# Patient Record
Sex: Male | Born: 1980 | Race: White | Hispanic: No | Marital: Single | Smoking: Never smoker
Health system: Southern US, Community
[De-identification: ages and names within clinical notes are randomized; demographics above are authoritative.]

## PROBLEM LIST (undated history)

## (undated) ENCOUNTER — Emergency Department (HOSPITAL_BASED_OUTPATIENT_CLINIC_OR_DEPARTMENT_OTHER): Admission: EM | Discharge status: Absent without leave | Payer: Medicare Other

## (undated) DIAGNOSIS — F419 Anxiety disorder, unspecified: Secondary | ICD-10-CM

## (undated) HISTORY — PX: APPENDECTOMY: SHX54

---

## 2013-02-09 ENCOUNTER — Encounter (HOSPITAL_BASED_OUTPATIENT_CLINIC_OR_DEPARTMENT_OTHER): Payer: Self-pay | Admitting: Family Medicine

## 2013-02-09 ENCOUNTER — Emergency Department (HOSPITAL_BASED_OUTPATIENT_CLINIC_OR_DEPARTMENT_OTHER)
Admission: EM | Admit: 2013-02-09 | Discharge: 2013-02-09 | Disposition: A | Discharge status: Home / Self Care | Payer: Non-veteran care | Attending: Emergency Medicine | Admitting: Emergency Medicine

## 2013-02-09 DIAGNOSIS — F411 Generalized anxiety disorder: Secondary | ICD-10-CM | POA: Insufficient documentation

## 2013-02-09 DIAGNOSIS — R197 Diarrhea, unspecified: Secondary | ICD-10-CM | POA: Diagnosis not present

## 2013-02-09 DIAGNOSIS — R110 Nausea: Secondary | ICD-10-CM | POA: Insufficient documentation

## 2013-02-09 DIAGNOSIS — R1013 Epigastric pain: Secondary | ICD-10-CM | POA: Diagnosis present

## 2013-02-09 DIAGNOSIS — K2970 Gastritis, unspecified, without bleeding: Secondary | ICD-10-CM

## 2013-02-09 DIAGNOSIS — K2990 Gastroduodenitis, unspecified, without bleeding: Secondary | ICD-10-CM | POA: Insufficient documentation

## 2013-02-09 DIAGNOSIS — Z79899 Other long term (current) drug therapy: Secondary | ICD-10-CM | POA: Insufficient documentation

## 2013-02-09 HISTORY — DX: Anxiety disorder, unspecified: F41.9

## 2013-02-09 LAB — COMPREHENSIVE METABOLIC PANEL
Albumin: 4.1 g/dL (ref 3.5–5.2)
Alkaline Phosphatase: 69 U/L (ref 39–117)
BUN: 10 mg/dL (ref 6–23)
Calcium: 9.7 mg/dL (ref 8.4–10.5)
GFR calc Af Amer: >90 mL/min (ref >90)
Glucose, Bld: 111 mg/dL — ABNORMAL HIGH (ref 70–99)
Potassium: 3.6 mEq/L (ref 3.5–5.1)
Total Protein: 7.4 g/dL (ref 6.0–8.3)

## 2013-02-09 LAB — URINALYSIS, ROUTINE W REFLEX MICROSCOPIC
Leukocytes, UA: NEGATIVE
Nitrite: NEGATIVE
Specific Gravity, Urine: 1.024 (ref 1.005–1.030)
Urobilinogen, UA: 0.2 mg/dL (ref 0.0–1.0)
pH: 5 (ref 5.0–8.0)

## 2013-02-09 LAB — CBC WITH DIFFERENTIAL/PLATELET
Basophils Relative: 0 % (ref 0–1)
Eosinophils Absolute: 0.1 10*3/uL (ref 0.0–0.7)
Eosinophils Relative: 1 % (ref 0–5)
Hemoglobin: 16.6 g/dL (ref 13.0–17.0)
Lymphs Abs: 2 10*3/uL (ref 0.7–4.0)
MCH: 31.4 pg (ref 26.0–34.0)
MCHC: 35.9 g/dL (ref 30.0–36.0)
MCV: 87.7 fL (ref 78.0–100.0)
Monocytes Relative: 9 % (ref 3–12)
Neutrophils Relative %: 56 % (ref 43–77)
Platelets: 212 10*3/uL (ref 150–400)

## 2013-02-09 LAB — LIPASE, BLOOD: Lipase: 23 U/L (ref 11–59)

## 2013-02-09 MED ORDER — PANTOPRAZOLE SODIUM 20 MG PO TBEC: 20.0000 mg | DELAYED_RELEASE_TABLET | Freq: Every day | ORAL | Status: AC

## 2013-02-09 MED ORDER — GI COCKTAIL ~~LOC~~
30.0000 mL | Freq: Once | ORAL | Status: AC
  Administered 2013-02-09: 30 mL via ORAL
  Filled 2013-02-09: qty 30

## 2013-02-09 NOTE — ED Notes (Addendum)
Pt c/o abdominal pain x 3 days with diarrhea on day 1. Pt sts he took immodium and diarrhea resolved. Denies n/v, fever. Pt sts pain feels like a "cramp". Pt reports eating and drinking normal.

## 2013-02-09 NOTE — ED Provider Notes (Signed)
History    CSN: [id number] Arrival date & time 02/09/13  0818  First MD Initiated Contact with Patient 02/09/13 [phone]     Chief Complaint  Patient presents with  . Abdominal Pain   (Consider location/radiation/quality/duration/timing/severity/associated sxs/prior Treatment) HPI Comments: Patient presents with a three-day history of worsening abdominal pain. He describes as an achy pain mostly in his epigastric area that radiates to his lower abdomen bilaterally. He had some nausea and diarrhea 3 days ago when it first started. He took an Imodium at that point and the diarrhea subsided. He currently denies any nausea or vomiting. He denies any fevers or chills. He denies any urinary symptoms. He does have a history of an appendectomy but no other history of bowel problems. He states the pain is not related to eating and then he's been receiving crackers and soup for the last couple days.  Patient is a 32 y.o. male presenting with abdominal pain.  Abdominal Pain Associated symptoms include abdominal pain. Pertinent negatives include no chest pain, no headaches and no shortness of breath.   Past Medical History  Diagnosis Date  . Anxiety    Past Surgical History  Procedure Laterality Date  . Appendectomy     No family history on file. History  Substance Use Topics  . Smoking status: Never Smoker   . Smokeless tobacco: Not on file  . Alcohol Use: Yes    Review of Systems  Constitutional: Negative for fever, chills, diaphoresis and fatigue.  HENT: Negative for congestion, rhinorrhea and sneezing.   Eyes: Negative.   Respiratory: Negative for cough, chest tightness and shortness of breath.   Cardiovascular: Negative for chest pain and leg swelling.  Gastrointestinal: Positive for nausea, abdominal pain and diarrhea. Negative for vomiting and blood in stool.  Genitourinary: Negative for frequency, hematuria, flank pain and difficulty urinating.  Musculoskeletal: Negative for back  pain and arthralgias.  Skin: Negative for rash.  Neurological: Negative for dizziness, speech difficulty, weakness, numbness and headaches.    Allergies  Review of patient's allergies indicates no known allergies.  Home Medications   Current Outpatient Rx  Name  Route  Sig  Dispense  Refill  . LORazepam (ATIVAN) 0.5 MG tablet   Oral   Take 0.5 mg by mouth as needed for anxiety.         . pantoprazole (PROTONIX) 20 MG tablet   Oral   Take 1 tablet (20 mg total) by mouth daily.   30 tablet   0    BP 130/92  Pulse 65  Temp(Src) 98 F (36.7 C) (Oral)  Resp 16  Ht 5\' 7"  (1.702 m)  Wt 202 lb (91.627 kg)  BMI 31.63 kg/m2  SpO2 100% Physical Exam  Constitutional: He is oriented to person, place, and time. He appears well-developed and well-nourished.  HENT:  Head: Normocephalic and atraumatic.  Eyes: Pupils are equal, round, and reactive to light.  Neck: Normal range of motion. Neck supple.  Cardiovascular: Normal rate, regular rhythm and normal heart sounds.   Pulmonary/Chest: Effort normal and breath sounds normal. No respiratory distress. He has no wheezes. He has no rales. He exhibits no tenderness.  Abdominal: Soft. Bowel sounds are normal. There is tenderness (mild tenderness on palpation of the epigastrium. There's also some mild tenderness across the lower abdomen but patient complains of most tenderness around the epigastrium. No CVA tenderness). There is no rebound and no guarding.  Musculoskeletal: Normal range of motion. He exhibits no edema.  Lymphadenopathy:  He has no cervical adenopathy.  Neurological: He is alert and oriented to person, place, and time.  Skin: Skin is warm and dry. No rash noted.  Psychiatric: He has a normal mood and affect.    ED Course  Procedures (including critical care time) Results for orders placed during the hospital encounter of 02/09/13  CBC WITH DIFFERENTIAL      Result Value Range   WBC 5.9  4.0 - 10.5 K/uL   RBC 5.28   4.22 - 5.81 MIL/uL   Hemoglobin 16.6  13.0 - 17.0 g/dL   HCT 16.1  09.6 - 04.5 %   MCV 87.7  78.0 - 100.0 fL   MCH 31.4  26.0 - 34.0 pg   MCHC 35.9  30.0 - 36.0 g/dL   RDW 40.9  81.1 - 91.4 %   Platelets 212  150 - 400 K/uL   Neutrophils Relative % 56  43 - 77 %   Neutro Abs 3.3  1.7 - 7.7 K/uL   Lymphocytes Relative 34  12 - 46 %   Lymphs Abs 2.0  0.7 - 4.0 K/uL   Monocytes Relative 9  3 - 12 %   Monocytes Absolute 0.5  0.1 - 1.0 K/uL   Eosinophils Relative 1  0 - 5 %   Eosinophils Absolute 0.1  0.0 - 0.7 K/uL   Basophils Relative 0  0 - 1 %   Basophils Absolute 0.0  0.0 - 0.1 K/uL  COMPREHENSIVE METABOLIC PANEL      Result Value Range   Sodium 139  135 - 145 mEq/L   Potassium 3.6  3.5 - 5.1 mEq/L   Chloride 103  96 - 112 mEq/L   CO2 25  19 - 32 mEq/L   Glucose, Bld 111 (*) 70 - 99 mg/dL   BUN 10  6 - 23 mg/dL   Creatinine, Ser 7.82  0.50 - 1.35 mg/dL   Calcium 9.7  8.4 - 95.6 mg/dL   Total Protein 7.4  6.0 - 8.3 g/dL   Albumin 4.1  3.5 - 5.2 g/dL   AST 17  0 - 37 U/L   ALT 27  0 - 53 U/L   Alkaline Phosphatase 69  39 - 117 U/L   Total Bilirubin 0.5  0.3 - 1.2 mg/dL   GFR calc non Af Amer >90  >90 mL/min   GFR calc Af Amer >90  >90 mL/min  LIPASE, BLOOD      Result Value Range   Lipase 23  11 - 59 U/L  URINALYSIS, ROUTINE W REFLEX MICROSCOPIC      Result Value Range   Color, Urine YELLOW  YELLOW   APPearance CLEAR  CLEAR   Specific Gravity, Urine 1.024  1.005 - 1.030   pH 5.0  5.0 - 8.0   Glucose, UA NEGATIVE  NEGATIVE mg/dL   Hgb urine dipstick NEGATIVE  NEGATIVE   Bilirubin Urine NEGATIVE  NEGATIVE   Ketones, ur NEGATIVE  NEGATIVE mg/dL   Protein, ur NEGATIVE  NEGATIVE mg/dL   Urobilinogen, UA 0.2  0.0 - 1.0 mg/dL   Nitrite NEGATIVE  NEGATIVE   Leukocytes, UA NEGATIVE  NEGATIVE   No results found.   No results found. 1. Gastritis     MDM  Patient is given a GI cocktail and is pain on this completely resolved with this. A repeat abdominal exam shows no  tenderness. There is no evidence of pancreatitis. I do not feel that at this point patient needs  CT imaging or ultrasound of his abdomen. There's no pain around the gallbladder. His liver enzymes are normal. He was discharged home in good condition with a prescription for Protonix. He was advised on a bland diet and to avoid alcohol and caffeine. He was given a referral to a gastroenterologist if his symptoms are not improving and I advised him return here if his pain worsens or he develops ongoing vomiting or fever.  Rolan Bucco, MD 02/09/13 1021

## 2015-05-15 ENCOUNTER — Encounter (HOSPITAL_BASED_OUTPATIENT_CLINIC_OR_DEPARTMENT_OTHER): Payer: Self-pay | Admitting: Adult Health

## 2015-05-15 ENCOUNTER — Emergency Department (HOSPITAL_BASED_OUTPATIENT_CLINIC_OR_DEPARTMENT_OTHER)
Admission: EM | Admit: 2015-05-15 | Discharge: 2015-05-15 | Disposition: A | Discharge status: Home / Self Care | Payer: Medicare Other | Attending: Emergency Medicine | Admitting: Emergency Medicine

## 2015-05-15 ENCOUNTER — Emergency Department (HOSPITAL_BASED_OUTPATIENT_CLINIC_OR_DEPARTMENT_OTHER): Payer: Medicare Other

## 2015-05-15 DIAGNOSIS — F419 Anxiety disorder, unspecified: Secondary | ICD-10-CM | POA: Insufficient documentation

## 2015-05-15 DIAGNOSIS — R000 Tachycardia, unspecified: Secondary | ICD-10-CM | POA: Diagnosis not present

## 2015-05-15 DIAGNOSIS — Z79899 Other long term (current) drug therapy: Secondary | ICD-10-CM | POA: Diagnosis not present

## 2015-05-15 DIAGNOSIS — J020 Streptococcal pharyngitis: Secondary | ICD-10-CM | POA: Insufficient documentation

## 2015-05-15 DIAGNOSIS — R109 Unspecified abdominal pain: Secondary | ICD-10-CM | POA: Diagnosis present

## 2015-05-15 LAB — URINE MICROSCOPIC-ADD ON

## 2015-05-15 LAB — CBC WITH DIFFERENTIAL/PLATELET
BASOS ABS: 0 10*3/uL (ref 0.0–0.1)
Basophils Relative: 0 %
EOS PCT: 0 %
Eosinophils Absolute: 0 10*3/uL (ref 0.0–0.7)
HEMATOCRIT: 45.7 % (ref 39.0–52.0)
Hemoglobin: 16.4 g/dL (ref 13.0–17.0)
LYMPHS PCT: 7 %
Lymphs Abs: 1.4 10*3/uL (ref 0.7–4.0)
MCH: 31.6 pg (ref 26.0–34.0)
MCHC: 35.9 g/dL (ref 30.0–36.0)
MCV: 88.1 fL (ref 78.0–100.0)
MONO ABS: 1.3 10*3/uL — AB (ref 0.1–1.0)
MONOS PCT: 7 %
NEUTROS ABS: 16.6 10*3/uL — AB (ref 1.7–7.7)
Neutrophils Relative %: 86 %
PLATELETS: 201 10*3/uL (ref 150–400)
RBC: 5.19 MIL/uL (ref 4.22–5.81)
RDW: 12.2 % (ref 11.5–15.5)
WBC: 19.3 10*3/uL — ABNORMAL HIGH (ref 4.0–10.5)

## 2015-05-15 LAB — BASIC METABOLIC PANEL
Anion gap: 7 (ref 5–15)
BUN: 10 mg/dL (ref 6–20)
CALCIUM: 8.7 mg/dL — AB (ref 8.9–10.3)
CO2: 22 mmol/L (ref 22–32)
CREATININE: 0.97 mg/dL (ref 0.61–1.24)
Chloride: 104 mmol/L (ref 101–111)
GFR calc Af Amer: >60 mL/min (ref >60)
GLUCOSE: 124 mg/dL — AB (ref 65–99)
Potassium: 3.3 mmol/L — ABNORMAL LOW (ref 3.5–5.1)
Sodium: 133 mmol/L — ABNORMAL LOW (ref 135–145)

## 2015-05-15 LAB — URINALYSIS, ROUTINE W REFLEX MICROSCOPIC
GLUCOSE, UA: NEGATIVE mg/dL
HGB URINE DIPSTICK: NEGATIVE
KETONES UR: 15 mg/dL — AB
Leukocytes, UA: NEGATIVE
NITRITE: NEGATIVE
PH: 6 (ref 5.0–8.0)
Protein, ur: 30 mg/dL — AB
SPECIFIC GRAVITY, URINE: 1.03 (ref 1.005–1.030)
Urobilinogen, UA: 4 mg/dL — ABNORMAL HIGH (ref 0.0–1.0)

## 2015-05-15 LAB — RAPID STREP SCREEN (MED CTR MEBANE ONLY): Streptococcus, Group A Screen (Direct): POSITIVE — AB

## 2015-05-15 LAB — I-STAT CG4 LACTIC ACID, ED: LACTIC ACID, VENOUS: 0.81 mmol/L (ref 0.5–2.0)

## 2015-05-15 MED ORDER — SODIUM CHLORIDE 0.9 % IV BOLUS (SEPSIS)
1000.0000 mL | Freq: Once | INTRAVENOUS | Status: AC
  Administered 2015-05-15: 1000 mL via INTRAVENOUS

## 2015-05-15 MED ORDER — DEXAMETHASONE SODIUM PHOSPHATE 10 MG/ML IJ SOLN
10.0000 mg | Freq: Once | INTRAMUSCULAR | Status: AC
  Administered 2015-05-15: 10 mg via INTRAVENOUS
  Filled 2015-05-15: qty 1

## 2015-05-15 MED ORDER — MORPHINE SULFATE (PF) 4 MG/ML IV SOLN
4.0000 mg | Freq: Once | INTRAVENOUS | Status: AC
  Administered 2015-05-15: 4 mg via INTRAVENOUS

## 2015-05-15 MED ORDER — ONDANSETRON HCL 4 MG/2ML IJ SOLN
4.0000 mg | Freq: Once | INTRAMUSCULAR | Status: AC
  Administered 2015-05-15: 4 mg via INTRAVENOUS
  Filled 2015-05-15: qty 2

## 2015-05-15 MED ORDER — MORPHINE SULFATE (PF) 4 MG/ML IV SOLN
INTRAVENOUS | Status: AC
  Filled 2015-05-15: qty 1

## 2015-05-15 MED ORDER — KETOROLAC TROMETHAMINE 30 MG/ML IJ SOLN
30.0000 mg | Freq: Once | INTRAMUSCULAR | Status: AC
  Administered 2015-05-15: 30 mg via INTRAVENOUS
  Filled 2015-05-15: qty 1

## 2015-05-15 MED ORDER — PENICILLIN G BENZATHINE 1200000 UNIT/2ML IM SUSP
1.2000 10*6.[IU] | Freq: Once | INTRAMUSCULAR | Status: AC
  Administered 2015-05-15: 1.2 10*6.[IU] via INTRAMUSCULAR
  Filled 2015-05-15: qty 2

## 2015-05-15 MED ORDER — HYDROCODONE-ACETAMINOPHEN 5-325 MG PO TABS: 1.0000 | ORAL_TABLET | Freq: Four times a day (QID) | ORAL | Status: AC | PRN

## 2015-05-15 NOTE — ED Notes (Signed)
PT presents with 3 days of nausea, vomiting, bilateral flank pain, generalized fatigue and myalgia.

## 2015-05-15 NOTE — ED Notes (Addendum)
Presents with 3 days of bilateral flank pain described as unbearable associated with nausea and vomiting-urine dark. Unable to hold fluids down. Endorses temp of 103 at home

## 2015-05-15 NOTE — ED Provider Notes (Signed)
CSN: [id number]     Arrival date & time 05/15/15  4098 History   First MD Initiated Contact with Patient 05/15/15 0559     Chief Complaint  Patient presents with  . Flank Pain     (Consider location/radiation/quality/duration/timing/severity/associated sxs/prior Treatment) HPI  This is a 34 year old male with no significant past medical history who presents with 3 day history of fever, back pain, nausea, vomiting, and dysuria. Reports fever of 103 at home yesterday. Patient reports 3 day history of bilateral mid back pain radiates into his abdomen and legs. He reports diffuse myalgias. He reports dark urine and dysuria 3 days ago. That has since resolved. He denies any cough. He does endorse swelling lymph nodes and sore throat. Denies any shortness of breath or chest pain. Denies abdominal pain. Has not received his flu shot this year.  Past Medical History  Diagnosis Date  . Anxiety    Past Surgical History  Procedure Laterality Date  . Appendectomy     History reviewed. No pertinent family history. Social History  Substance Use Topics  . Smoking status: Never Smoker   . Smokeless tobacco: None  . Alcohol Use: Yes    Review of Systems  Constitutional: Positive for fever and chills.  HENT: Positive for sore throat.   Respiratory: Negative.  Negative for cough, chest tightness and shortness of breath.   Cardiovascular: Negative.  Negative for chest pain.  Gastrointestinal: Positive for nausea and vomiting. Negative for abdominal pain and diarrhea.  Genitourinary: Negative.  Negative for dysuria.  Musculoskeletal: Positive for myalgias and back pain.  Skin: Negative for rash.  Neurological: Negative for headaches.  All other systems reviewed and are negative.     Allergies  Review of patient's allergies indicates no known allergies.  Home Medications   Prior to Admission medications   Medication Sig Start Date End Date Taking? Authorizing Provider   HYDROcodone-acetaminophen (NORCO/VICODIN) 5-325 MG tablet Take 1 tablet by mouth every 6 (six) hours as needed. 05/15/15   Shon Baton, MD  LORazepam (ATIVAN) 0.5 MG tablet Take 0.5 mg by mouth as needed for anxiety.    Historical Provider, MD  pantoprazole (PROTONIX) 20 MG tablet Take 1 tablet (20 mg total) by mouth daily. 02/09/13   Rolan Bucco, MD   BP 144/100 mmHg  Pulse 110  Temp(Src) 99.6 F (37.6 C) (Oral)  Resp 18  Ht  (1.727 m)  Wt 195 lb (88.451 kg)  BMI 29.66 kg/m2  SpO2 100% Physical Exam  Constitutional: He is oriented to person, place, and time. He appears well-developed and well-nourished. No distress.  HENT:  Head: Normocephalic and atraumatic.  Bilateral tonsillar enlargement, uvula midline, mild erythema of the posterior oropharynx  Eyes: Pupils are equal, round, and reactive to light.  Neck: Neck supple.  Cardiovascular: Regular rhythm and normal heart sounds.   No murmur heard. Tachycardia  Pulmonary/Chest: Effort normal and breath sounds normal. No respiratory distress. He has no wheezes.  Abdominal: Soft. Bowel sounds are normal. There is no tenderness. There is no rebound and no guarding.  Genitourinary:  No CVA tenderness  Musculoskeletal: He exhibits no edema.  Diffuse multiple tenderness over the back, no midline tenderness  Lymphadenopathy:    He has cervical adenopathy.  Neurological: He is alert and oriented to person, place, and time.  Skin: Skin is warm and dry.  Psychiatric: He has a normal mood and affect.  Nursing note and vitals reviewed.   ED Course  Procedures (including critical care  time) Labs Review Labs Reviewed  RAPID STREP SCREEN (NOT AT [facility]) - Abnormal; Notable for the following:    Streptococcus, Group A Screen (Direct) POSITIVE (*)    All other components within normal limits  CBC WITH DIFFERENTIAL/PLATELET - Abnormal; Notable for the following:    WBC 19.3 (*)    Neutro Abs 16.6 (*)    Monocytes Absolute 1.3  (*)    All other components within normal limits  BASIC METABOLIC PANEL - Abnormal; Notable for the following:    Sodium 133 (*)    Potassium 3.3 (*)    Glucose, Bld 124 (*)    Calcium 8.7 (*)    All other components within normal limits  URINALYSIS, ROUTINE W REFLEX MICROSCOPIC (NOT AT [facility]) - Abnormal; Notable for the following:    Color, Urine AMBER (*)    Bilirubin Urine SMALL (*)    Ketones, ur 15 (*)    Protein, ur 30 (*)    Urobilinogen, UA 4.0 (*)    All other components within normal limits  URINE MICROSCOPIC-ADD ON  I-STAT CG4 LACTIC ACID, ED    Imaging Review Dg Abd Acute W/chest  05/15/2015  CLINICAL DATA:  Three days of bilateral flank pain, nausea, dark urine. Fever. Myalgia. EXAM: DG ABDOMEN ACUTE W/ 1V CHEST COMPARISON:  None. FINDINGS: Cardiomediastinal silhouette is normal. Strandy densities LEFT lung base. Lungs are otherwise clear, no pleural effusions. No pneumothorax. Soft tissue planes and included osseous structures are unremarkable. Bowel gas pattern is nondilated and nonobstructive. No intra-abdominal mass effect, pathologic calcifications or free air. Soft tissue planes and included osseous structures are non-suspicious. IMPRESSION: LEFT lung base atelectasis. Normal bowel gas pattern. Electronically Signed   By: Awilda Metroourtnay  Bloomer M.D.   On: 05/15/2015 06:49   I have personally reviewed and evaluated these images and lab results as part of my medical decision-making.   EKG Interpretation None      Medications  penicillin g benzathine (BICILLIN LA) 1200000 UNIT/2ML injection 1.2 Million Units (not administered)  dexamethasone (DECADRON) injection 10 mg (not administered)  sodium chloride 0.9 % bolus 1,000 mL (not administered)  ketorolac (TORADOL) 30 MG/ML injection 30 mg (not administered)  sodium chloride 0.9 % bolus 1,000 mL (1,000 mLs Intravenous New Bag/Given 05/15/15 0626)  ondansetron (ZOFRAN) injection 4 mg (4 mg Intravenous Given 05/15/15 0626)   morphine 4 MG/ML injection 4 mg (4 mg Intravenous Given 05/15/15 0627)    MDM   Final diagnoses:  Strep pharyngitis    Patient presents with back pain, nausea, vomiting or generalized fatigue and myalgia. He is describing more myalgias than flank pain. He also has cervical adenopathy and bilateral tonsillar enlargement with erythema. Given fever, strep and flu are both considerations. Full workup was initiated including lab work and urinalysis. Lactate is normal. Patient has a leukocytosis to 19.  Mild hyponatremia. Strep screen is positive. Patient was given 2 L of fluid, Decadron, Toradol, penicillin.  Anticipate discharge. Patient signed out pending treatment and fluid administration.    Shon Batonourtney F Darcel Frane, MD 05/15/15 [phone]

## 2015-05-15 NOTE — ED Provider Notes (Signed)
Patient care from Dr. Wilkie AyeHorton. Reevaluated after fluids, Bicillin, steroids. He's feeling much better and is comfortable going home.  1. Strep pharyngitis      Eduardo MochaBlair Joaopedro Eschbach, MD 05/15/15 [phone]

## 2015-05-15 NOTE — Discharge Instructions (Signed)
Strep Throat Strep throat is a bacterial infection of the throat. Your health care provider may Mccoll the infection tonsillitis or pharyngitis, depending on whether there is swelling in the tonsils or at the back of the throat. Strep throat is most common during the cold months of the year in children who are 5-35 years of age, but it can happen during any season in people of any age. This infection is spread from person to person (contagious) through coughing, sneezing, or close contact. CAUSES Strep throat is caused by the bacteria called Streptococcus pyogenes. RISK FACTORS This condition is more likely to develop in:  People who spend time in crowded places where the infection can spread easily.  People who have close contact with someone who has strep throat. SYMPTOMS Symptoms of this condition include:  Fever or chills.   Redness, swelling, or pain in the tonsils or throat.  Pain or difficulty when swallowing.  White or yellow spots on the tonsils or throat.  Swollen, tender glands in the neck or under the jaw.  Red rash all over the body (rare). DIAGNOSIS This condition is diagnosed by performing a rapid strep test or by taking a swab of your throat (throat culture test). Results from a rapid strep test are usually ready in a few minutes, but throat culture test results are available after one or two days. TREATMENT This condition is treated with antibiotic medicine. HOME CARE INSTRUCTIONS Medicines  Take over-the-counter and prescription medicines only as told by your health care provider.  Take your antibiotic as told by your health care provider. Do not stop taking the antibiotic even if you start to feel better.  Have family members who also have a sore throat or fever tested for strep throat. They may need antibiotics if they have the strep infection. Eating and Drinking  Do not share food, drinking cups, or personal items that could cause the infection to spread to  other people.  If swallowing is difficult, try eating soft foods until your sore throat feels better.  Drink enough fluid to keep your urine clear or pale yellow. General Instructions  Gargle with a salt-water mixture 3-4 times per day or as needed. To make a salt-water mixture, completely dissolve -1 tsp of salt in 1 cup of warm water.  Make sure that all household members wash their hands well.  Get plenty of rest.  Stay home from school or work until you have been taking antibiotics for 24 hours.  Keep all follow-up visits as told by your health care provider. This is important. SEEK MEDICAL CARE IF:  The glands in your neck continue to get bigger.  You develop a rash, cough, or earache.  You cough up a thick liquid that is green, yellow-brown, or bloody.  You have pain or discomfort that does not get better with medicine.  Your problems seem to be getting worse rather than better.  You have a fever. SEEK IMMEDIATE MEDICAL CARE IF:  You have new symptoms, such as vomiting, severe headache, stiff or painful neck, chest pain, or shortness of breath.  You have severe throat pain, drooling, or changes in your voice.  You have swelling of the neck, or the skin on the neck becomes red and tender.  You have signs of dehydration, such as fatigue, dry mouth, and decreased urination.  You become increasingly sleepy, or you cannot wake up completely.  Your joints become red or painful.   This information is not intended to replace   advice given to you by your health care provider. Make sure you discuss any questions you have with your health care provider.   Document Released: 07/04/2000 Document Revised: 03/28/2015 Document Reviewed: 10/30/2014 Elsevier Interactive Patient Education 2016 [employer]  

## 2015-10-08 ENCOUNTER — Emergency Department (HOSPITAL_BASED_OUTPATIENT_CLINIC_OR_DEPARTMENT_OTHER): Payer: Medicare Other

## 2015-10-08 ENCOUNTER — Encounter (HOSPITAL_BASED_OUTPATIENT_CLINIC_OR_DEPARTMENT_OTHER): Payer: Self-pay | Admitting: Emergency Medicine

## 2015-10-08 ENCOUNTER — Emergency Department (HOSPITAL_BASED_OUTPATIENT_CLINIC_OR_DEPARTMENT_OTHER)
Admission: EM | Admit: 2015-10-08 | Discharge: 2015-10-08 | Disposition: A | Discharge status: Home / Self Care | Payer: Medicare Other | Attending: Emergency Medicine | Admitting: Emergency Medicine

## 2015-10-08 DIAGNOSIS — J069 Acute upper respiratory infection, unspecified: Secondary | ICD-10-CM | POA: Diagnosis not present

## 2015-10-08 DIAGNOSIS — Z79899 Other long term (current) drug therapy: Secondary | ICD-10-CM | POA: Insufficient documentation

## 2015-10-08 DIAGNOSIS — R079 Chest pain, unspecified: Secondary | ICD-10-CM | POA: Diagnosis not present

## 2015-10-08 DIAGNOSIS — R0602 Shortness of breath: Secondary | ICD-10-CM | POA: Insufficient documentation

## 2015-10-08 DIAGNOSIS — F4310 Post-traumatic stress disorder, unspecified: Secondary | ICD-10-CM | POA: Insufficient documentation

## 2015-10-08 DIAGNOSIS — J029 Acute pharyngitis, unspecified: Secondary | ICD-10-CM | POA: Diagnosis present

## 2015-10-08 DIAGNOSIS — F419 Anxiety disorder, unspecified: Secondary | ICD-10-CM | POA: Diagnosis not present

## 2015-10-08 LAB — RAPID STREP SCREEN (MED CTR MEBANE ONLY): Streptococcus, Group A Screen (Direct): NEGATIVE

## 2015-10-08 MED ORDER — TRAMADOL HCL 50 MG PO TABS
50.0000 mg | ORAL_TABLET | Freq: Once | ORAL | Status: AC
  Administered 2015-10-08: 50 mg via ORAL
  Filled 2015-10-08: qty 1

## 2015-10-08 MED ORDER — OSELTAMIVIR PHOSPHATE 75 MG PO CAPS: 75.0000 mg | ORAL_CAPSULE | Freq: Two times a day (BID) | ORAL | Status: AC

## 2015-10-08 MED ORDER — GI COCKTAIL ~~LOC~~
30.0000 mL | Freq: Once | ORAL | Status: AC
Start: 2015-10-08 — End: 2015-10-08
  Administered 2015-10-08: 30 mL via ORAL
  Filled 2015-10-08: qty 30

## 2015-10-08 NOTE — ED Provider Notes (Signed)
CSN: [id number]     Arrival date & time 10/08/15  1721 History   First MD Initiated Contact with Patient 10/08/15 1817     Chief Complaint  Patient presents with  . Sore Throat     HPI  35 year old male with significant history of anxiety, PTSD presenting for sore throat, cough, fever that started yesterday. Last night patient girlfriend took his temperature and told him he had a fever. He doesn't know what his temperature was.  When he started to feel ill he developed sharp left-sided chest pain. He had shortness of breath with these episodes. His history of anxiety attacks during which he had very similar chest pain with shortness of breath. Currently takes lorazepam for anxiety and no other medications for this. He last took lorazepam last night. He takes it in varying amounts per day, up to 4 times per day. He reports that his illness was stressful to him and he did feel anxious. His only known family member with heart attack is his grandfather who is in his 8080s at the time.  His girlfriend is his only known sick contact. She had similar symptoms that started several days prior to his symptoms. Otherwise he denies headache, denies, changes in vision, nausea, vomiting, diarrhea.  He has not had a flu shot his year  Past Medical History  Diagnosis Date  . Anxiety    Past Surgical History  Procedure Laterality Date  . Appendectomy     History reviewed. No pertinent family history. Social History  Substance Use Topics  . Smoking status: Never Smoker   . Smokeless tobacco: None  . Alcohol Use: Yes    Review of Systems  Constitutional: Negative.   HENT: Negative.   Eyes: Negative.   Respiratory: Positive for cough and shortness of breath. Negative for apnea, choking, chest tightness, wheezing and stridor.   Cardiovascular: Positive for chest pain. Negative for palpitations and leg swelling.  Gastrointestinal: Negative.   Genitourinary: Negative.   Musculoskeletal: Negative.    Skin: Negative.   Psychiatric/Behavioral: Negative.       Allergies  Review of patient's allergies indicates no known allergies.  Home Medications   Prior to Admission medications   Medication Sig Start Date End Date Taking? Authorizing Provider  HYDROcodone-acetaminophen (NORCO/VICODIN) 5-325 MG tablet Take 1 tablet by mouth every 6 (six) hours as needed. 05/15/15   Shon Batonourtney F Horton, MD  LORazepam (ATIVAN) 0.5 MG tablet Take 0.5 mg by mouth as needed for anxiety.    Historical Provider, MD  oseltamivir (TAMIFLU) 75 MG capsule Take 1 capsule (75 mg total) by mouth 2 (two) times daily. 10/08/15   Dewight Catino A Keishana Klinger, MD  pantoprazole (PROTONIX) 20 MG tablet Take 1 tablet (20 mg total) by mouth daily. 02/09/13   Rolan BuccoMelanie Belfi, MD   BP 129/88 mmHg  Pulse 87  Temp(Src) 99.1 F (37.3 C) (Oral)  Resp 16  Ht 5\' 7"  (1.702 m)  Wt 86.183 kg  BMI 29.75 kg/m2  SpO2 99% Physical Exam  Constitutional: He is oriented to person, place, and time. He appears well-developed and well-nourished.  HENT:  Head: Normocephalic and atraumatic.  Eyes: Conjunctivae and EOM are normal. Pupils are equal, round, and reactive to light.  Neck: Normal range of motion.  Cardiovascular: Normal rate, regular rhythm and normal heart sounds.   Pulmonary/Chest: Effort normal and breath sounds normal. No respiratory distress.  Abdominal: Soft. Bowel sounds are normal. He exhibits no distension.  Musculoskeletal: Normal range of motion.  Neurological: He  is alert and oriented to person, place, and time.  Skin: Skin is warm and dry.    ED Course  Procedures (including critical care time) Labs Review Labs Reviewed  RAPID STREP SCREEN (NOT AT [facility])  CULTURE, GROUP A STREP [facility])    Imaging Review Dg Chest 2 View  10/08/2015  CLINICAL DATA:  Cough, fever, and central chest pain since yesterday. EXAM: CHEST  2 VIEW COMPARISON:  05/15/2015 FINDINGS: Normal heart size and pulmonary vascularity. No focal airspace  disease or consolidation in the lungs. No blunting of costophrenic angles. No pneumothorax. Mediastinal contours appear intact. Azygos lobe. IMPRESSION: No active cardiopulmonary disease. Electronically Signed   By: Burman Nieves M.D.   On: 10/08/2015 18:04   I have personally reviewed and evaluated these images and lab results as part of my medical decision-making.   EKG Interpretation   Date/Time:  Monday October 08 2015 18:35:14 EDT Ventricular Rate:  85 PR Interval:  122 QRS Duration: 85 QT Interval:  346 QTC Calculation: 411 R Axis:   30 Text Interpretation:  Sinus rhythm Borderline T abnormalities, anterior  leads Confirmed by BEATON  MD, ROBERT ([id number]) on 10/08/2015 6:49:19 PM      MDM   Final diagnoses:  Viral URI    35 y/o male with cough, cold symptoms likely due to viral URI. As he has had a flu shot this year has URI symptoms with history of fever, Influenza is high on the differential. Pneumonia also on the diffferential, however chest xray is negative for consolidation. Rapid strep was negative ruling out streptococcus infection  Chest pain, given history of exact chest pain symptoms in setting of anxiety, young age and normal EKG make cardiac cause of chest pain less likely. His symptoms are likely due to anxiety surrounding feeling unwell  He currntly takes only Lorazepam for anxiety and PTSD symptoms. It was discussed that if he dos not feel that ths is controlling his symptoms, he should speak to his PCP for more long acting medications  He was discharged with Tamiflu for concern of possible Influenza and ED return precautions. He was counseled to follow with his PCP within the next week  Letishia Elliott A. Kennon Rounds MD, MS Family Medicine Resident PGY-2 Pager [phone]     Bonney Aid, MD 10/08/15 2125  Nelva Nay, MD 10/09/15 [phone]

## 2015-10-08 NOTE — Discharge Instructions (Signed)
Your diagnosed with an upper respiratory tract infection, which could be the flu. You were prescribed Tamiflu. Please take this twice a day for 5 days. Please follow with your primary care provider for your anxiety and optimal medical management. If you develop worsening chest pain, short of breath, fevers return to the emergency department for evaluation

## 2015-10-08 NOTE — ED Notes (Addendum)
Patient reports that he started to have a sore throat and then a cough. Reports that he has a burning feeling to his chest that feels like acid reflux, but it was after he coughed last night.  Patient states that the pain is all up and down from his mid epigastric region to his mid chest, at times is burning and at times it is a sharp intense pain

## 2015-10-11 LAB — CULTURE, GROUP A STREP (THRC)

## 2016-08-17 IMAGING — CR DG CHEST 2V
2 series · 2 of 2 positions shown · non-contrast
Comparison: 05/15/2015

CLINICAL DATA: Cough, fever, and central chest pain since
yesterday.

EXAM:
CHEST  2 VIEW

[w chest pa]
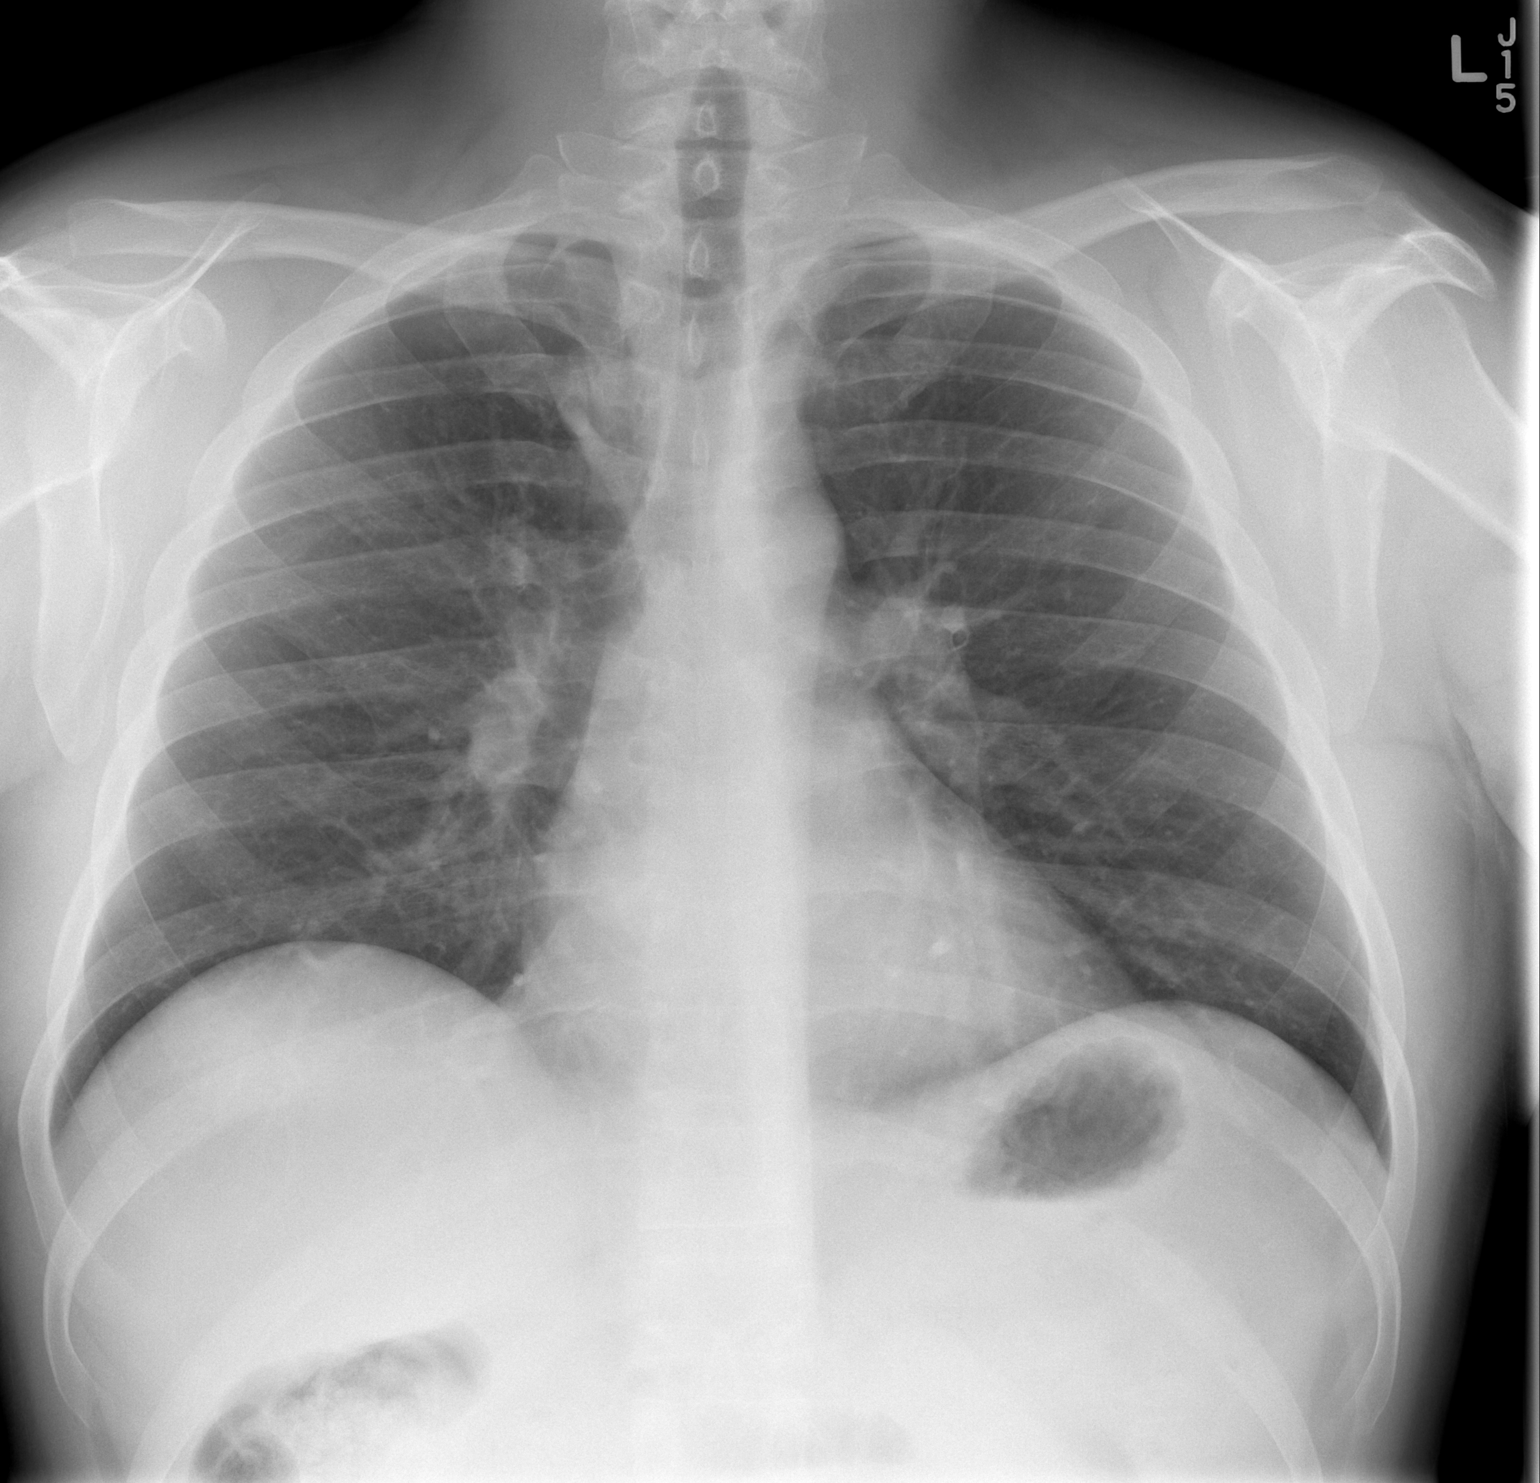

[w chest lat]
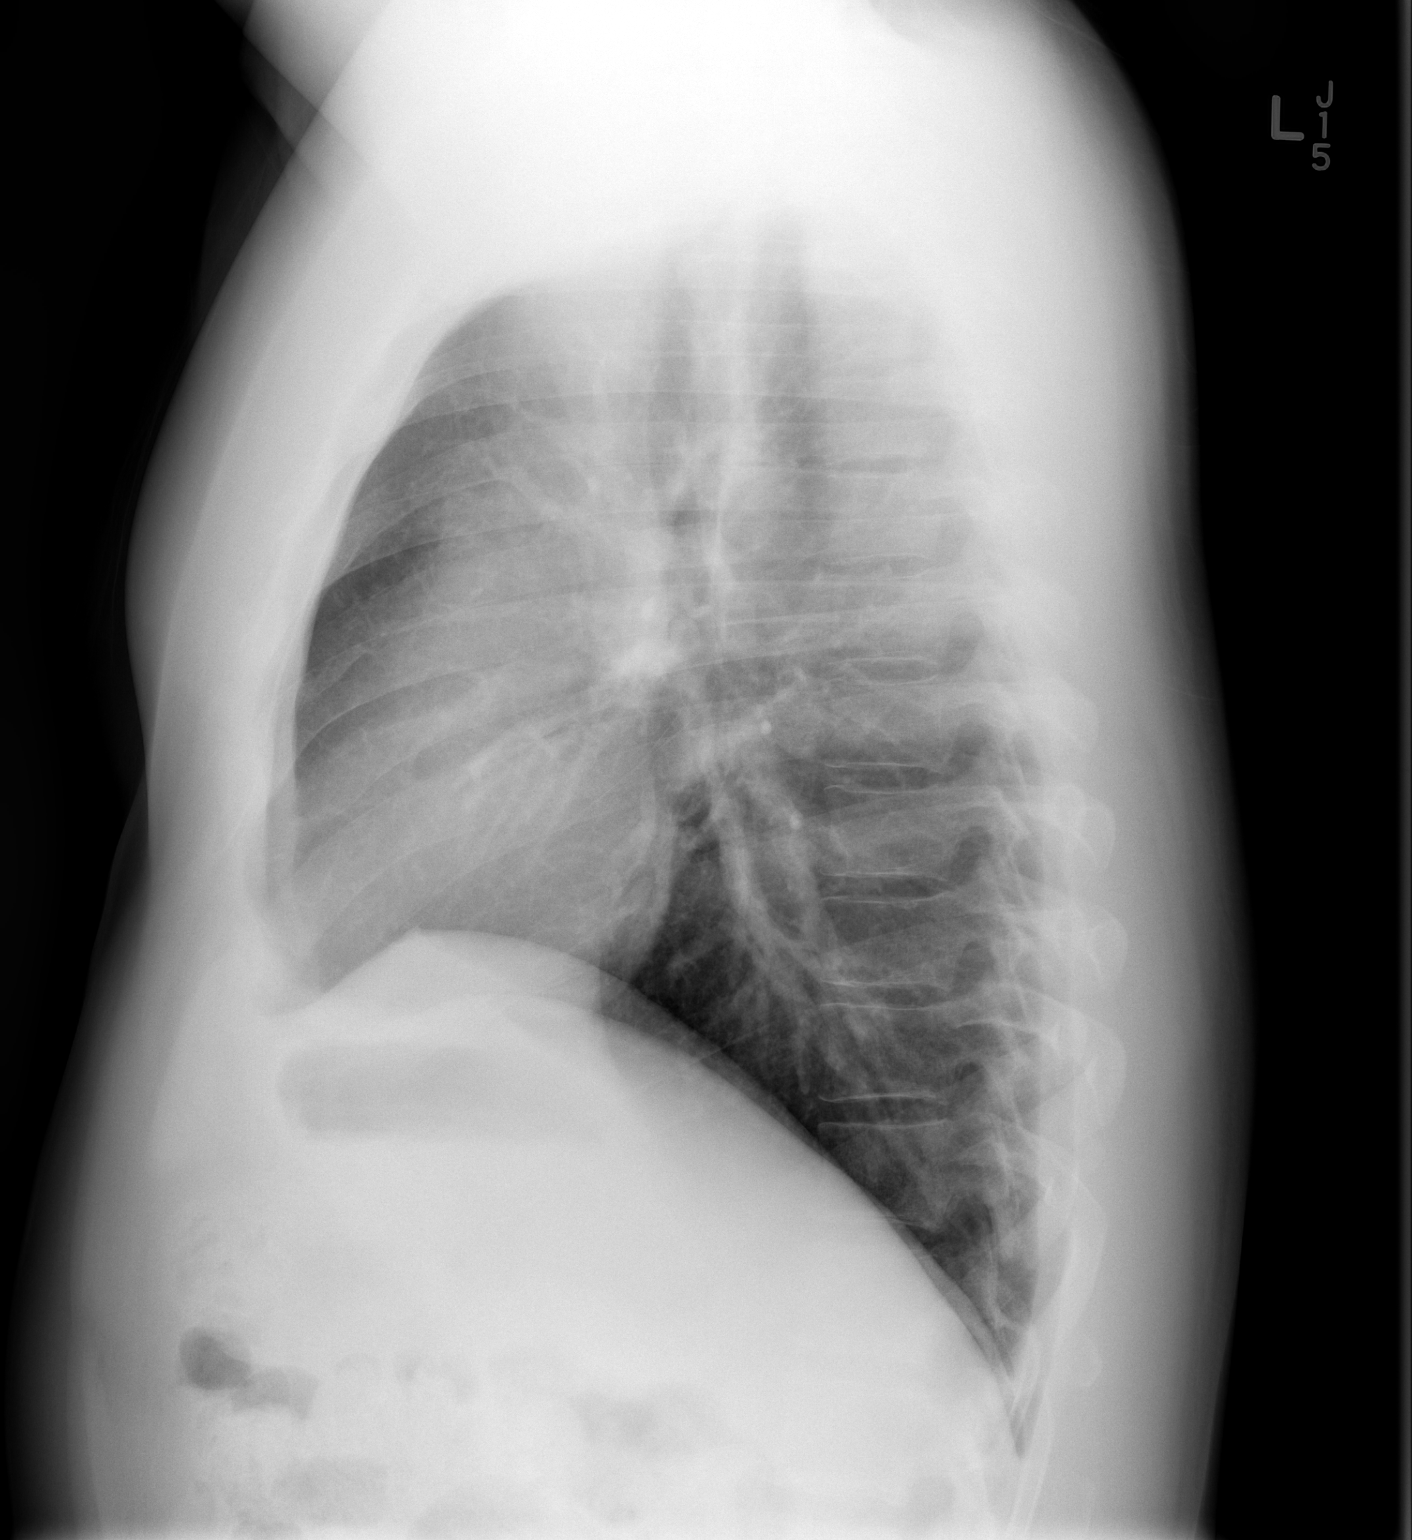

[2 of 2 positions shown; findings below may reference images not displayed]

FINDINGS: Normal heart size and pulmonary vascularity. No focal airspace
disease or consolidation in the lungs. No blunting of costophrenic
angles. No pneumothorax. Mediastinal contours appear intact. Azygos
lobe.
IMPRESSION: No active cardiopulmonary disease.
